# Patient Record
Sex: Female | Born: 1960 | Race: White | Hispanic: No | Marital: Married | Smoking: Never smoker
Health system: Southern US, Community
[De-identification: ages and names within clinical notes are randomized; demographics above are authoritative.]

---

## 1997-06-18 ENCOUNTER — Ambulatory Visit (HOSPITAL_COMMUNITY): Admission: RE | Admit: 1997-06-18 | Discharge: 1997-06-18 | Payer: Self-pay | Admitting: Obstetrics and Gynecology

## 1998-09-03 ENCOUNTER — Other Ambulatory Visit: Admission: RE | Admit: 1998-09-03 | Discharge: 1998-09-03 | Payer: Self-pay | Admitting: Obstetrics and Gynecology

## 1999-03-23 ENCOUNTER — Other Ambulatory Visit: Admission: RE | Admit: 1999-03-23 | Discharge: 1999-03-23 | Payer: Self-pay | Admitting: *Deleted

## 1999-03-29 ENCOUNTER — Inpatient Hospital Stay (HOSPITAL_COMMUNITY): Admission: AD | Admit: 1999-03-29 | Discharge: 1999-03-29 | Payer: Self-pay | Admitting: Obstetrics and Gynecology

## 1999-03-29 ENCOUNTER — Encounter: Payer: Self-pay | Admitting: Obstetrics and Gynecology

## 1999-04-01 ENCOUNTER — Ambulatory Visit (HOSPITAL_COMMUNITY): Admission: RE | Admit: 1999-04-01 | Discharge: 1999-04-01 | Payer: Self-pay | Admitting: Obstetrics and Gynecology

## 1999-04-01 ENCOUNTER — Encounter (INDEPENDENT_AMBULATORY_CARE_PROVIDER_SITE_OTHER): Payer: Self-pay | Admitting: *Deleted

## 1999-10-25 ENCOUNTER — Other Ambulatory Visit: Admission: RE | Admit: 1999-10-25 | Discharge: 1999-10-25 | Payer: Self-pay | Admitting: Obstetrics and Gynecology

## 2000-10-24 ENCOUNTER — Other Ambulatory Visit: Admission: RE | Admit: 2000-10-24 | Discharge: 2000-10-24 | Payer: Self-pay | Admitting: Obstetrics and Gynecology

## 2002-09-26 ENCOUNTER — Other Ambulatory Visit: Admission: RE | Admit: 2002-09-26 | Discharge: 2002-09-26 | Payer: Self-pay | Admitting: Obstetrics and Gynecology

## 2002-11-21 ENCOUNTER — Ambulatory Visit (HOSPITAL_COMMUNITY): Admission: RE | Admit: 2002-11-21 | Discharge: 2002-11-21 | Payer: Self-pay | Admitting: Obstetrics and Gynecology

## 2002-11-21 ENCOUNTER — Encounter: Payer: Self-pay | Admitting: Obstetrics and Gynecology

## 2002-12-18 ENCOUNTER — Encounter: Payer: Self-pay | Admitting: Obstetrics and Gynecology

## 2002-12-18 ENCOUNTER — Ambulatory Visit (HOSPITAL_COMMUNITY): Admission: RE | Admit: 2002-12-18 | Discharge: 2002-12-18 | Payer: Self-pay | Admitting: Obstetrics and Gynecology

## 2003-02-12 ENCOUNTER — Ambulatory Visit (HOSPITAL_COMMUNITY): Admission: RE | Admit: 2003-02-12 | Discharge: 2003-02-12 | Payer: Self-pay | Admitting: Obstetrics and Gynecology

## 2003-02-28 ENCOUNTER — Inpatient Hospital Stay (HOSPITAL_COMMUNITY): Admission: AD | Admit: 2003-02-28 | Discharge: 2003-02-28 | Payer: Self-pay | Admitting: Obstetrics and Gynecology

## 2003-04-10 ENCOUNTER — Inpatient Hospital Stay (HOSPITAL_COMMUNITY): Admission: AD | Admit: 2003-04-10 | Discharge: 2003-04-14 | Payer: Self-pay | Admitting: Obstetrics and Gynecology

## 2008-09-11 ENCOUNTER — Ambulatory Visit: Payer: Self-pay | Admitting: Oncology

## 2008-09-25 LAB — CBC WITH DIFFERENTIAL/PLATELET
BASO%: 0.4 % (ref 0.0–2.0)
Basophils Absolute: 0 10*3/uL (ref 0.0–0.1)
EOS%: 1.8 % (ref 0.0–7.0)
HCT: 33.6 % — ABNORMAL LOW (ref 34.8–46.6)
MCV: 68.7 fL — ABNORMAL LOW (ref 79.5–101.0)
MONO#: 0.5 10*3/uL (ref 0.1–0.9)
NEUT#: 2.8 10*3/uL (ref 1.5–6.5)
Platelets: 195 10*3/uL (ref 145–400)
RDW: 15.9 % — ABNORMAL HIGH (ref 11.2–14.5)
lymph#: 2.1 10*3/uL (ref 0.9–3.3)

## 2008-09-25 LAB — MORPHOLOGY

## 2008-09-29 LAB — PROTEIN ELECTROPHORESIS, SERUM
Alpha-1-Globulin: 3.5 % (ref 2.9–4.9)
Gamma Globulin: 20.4 % — ABNORMAL HIGH (ref 11.1–18.8)

## 2008-09-29 LAB — FOLATE: Folate: 13.3 ng/mL

## 2008-09-29 LAB — VITAMIN B12: Vitamin B-12: 409 pg/mL (ref 211–911)

## 2008-09-29 LAB — IRON AND TIBC: Iron: 77 ug/dL (ref 42–145)

## 2008-09-30 LAB — FECAL OCCULT BLOOD, GUAIAC: Occult Blood: NEGATIVE

## 2008-10-02 LAB — HEMOGLOBINOPATHY EVALUATION
Hemoglobin Other: 0 % (ref 0.0–0.0)
Hgb F Quant: 0 % (ref 0.0–2.0)
Hgb S Quant: 0 % (ref 0.0–0.0)

## 2008-11-23 ENCOUNTER — Ambulatory Visit: Payer: Self-pay | Admitting: Oncology

## 2009-08-08 ENCOUNTER — Emergency Department (HOSPITAL_COMMUNITY): Admission: EM | Admit: 2009-08-08 | Discharge: 2009-08-08 | Payer: Self-pay | Admitting: Emergency Medicine

## 2010-06-21 LAB — DIFFERENTIAL
Eosinophils Relative: 3 % (ref 0–5)
Lymphocytes Relative: 37 % (ref 12–46)
Lymphs Abs: 1.7 10*3/uL (ref 0.7–4.0)
Monocytes Absolute: 0.5 10*3/uL (ref 0.1–1.0)
Monocytes Relative: 12 % (ref 3–12)

## 2010-06-21 LAB — CBC
MCV: 73.4 fL — ABNORMAL LOW (ref 78.0–100.0)
Platelets: 288 10*3/uL (ref 150–400)
WBC: 4.6 10*3/uL (ref 4.0–10.5)

## 2010-06-21 LAB — COMPREHENSIVE METABOLIC PANEL
AST: 21 U/L (ref 0–37)
Albumin: 4.2 g/dL (ref 3.5–5.2)
Calcium: 8.7 mg/dL (ref 8.4–10.5)
Chloride: 105 mEq/L (ref 96–112)
Creatinine, Ser: 0.78 mg/dL (ref 0.4–1.2)
GFR calc Af Amer: >60 mL/min (ref >60)
Total Protein: 8.1 g/dL (ref 6.0–8.3)

## 2010-06-21 LAB — URINALYSIS, ROUTINE W REFLEX MICROSCOPIC
Glucose, UA: NEGATIVE mg/dL
Hgb urine dipstick: NEGATIVE
Specific Gravity, Urine: 1.033 — ABNORMAL HIGH (ref 1.005–1.030)

## 2010-08-19 NOTE — Discharge Summary (Signed)
NAME:  Taylor Contreras, Taylor Contreras                           ACCOUNT NO.:  [id number]   MEDICAL RECORD NO.:  [id number]                   PATIENT TYPE:  INP   LOCATION:  9138                                 FACILITY:  [facility]   PHYSICIAN:  Osborn Coho, M.D.                DATE OF BIRTH:  13-May-1960   DATE OF ADMISSION:  04/10/2003  DATE OF DISCHARGE:  04/14/2003                                 DISCHARGE SUMMARY   ADMISSION DIAGNOSES:  1. Intrauterine pregnancy at term.  2. Spontaneous rupture of the membranes.  3. Advanced maternal age.  4. History of fibroids.  5. Group B Strep negative.   DISCHARGE DIAGNOSES:  1. Intrauterine pregnancy at term.  2. Spontaneous rupture of the membranes.  3. Advanced maternal age.  4. History of fibroids.  5. Group B Strep negative.  6. Nonreassuring fetal heart rate tracing during labor.  7. Status post primary low transverse cesarean section.  8. Breast-feeding.  9. Plans IUD for contraception.   PROCEDURE:  Primary low transverse cesarean section for delivery of a viable  female infant named Taylor Contreras who weighed 7 pounds and had Apgars of 8 and 9 on  April 11, 2003 attended in delivery by Dr. Leonard Schwartz and Nigel Bridgeman, CNM.   HOSPITAL COURSE:  Taylor Contreras is a 50 year old married black female gravida  6, para 3-0-2-3 at [redacted] weeks gestation who was admitted with spontaneous  rupture of the membranes with clear fluid on April 10, 2003.  She was  started on Pitocin after admission secondary to lack of labor progression.  She then was noted to have some late decelerations and an Amnioinfusion was  started which resolved the decelerations around 8:30 p.m. on April 10, 2003.  As she progressed in labor, however, late decelerations reoccurred  and did not resolve once the Pitocin was discontinued and other  resuscitative measures were taken and at this point she was 8 cm, at a 0  station with continued late decelerations.  A cesarean section  was  recommended and accepted by the patient and she underwent the same for  delivery of a viable female infant named Taylor Contreras who had Apgars of 8 and 9  and weighed 7 pounds.  She was noted to be in OP presentation upon delivery.  She was delivered approximately 1 a.m. on April 11, 2003 attended in  delivery by Dr. Leonard Schwartz and Nigel Bridgeman CNM.  Please see  operative note for further details.  Postoperatively the patient has done  well.  She is ambulating, voiding, and eating without difficulty.  She is  also breast-feeding without difficulty.  She plans the Mirena IUD for  contraception at six weeks.  She is deemed ready for discharge today.   DISCHARGE INSTRUCTIONS:  As per the [facility] handout.   DISCHARGE MEDICATIONS:  1. Motrin 600 mg p.o. q.6h. p.r.n. for pain.  2. Tylox one to two p.o. q.4-6h. p.r.n. for pain.  3. Prenatal vitamins daily.   DISCHARGE LABORATORIES:  Hemoglobin 8.7, WBC count 8.8, platelets 165,000.   DISCHARGE FOLLOWUP:  In four weeks at [facility] for prep for  IUD insertion at six weeks or p.r.n.     Concha Pyo. Duplantis, C.N.M.              Osborn Coho, M.D.    SJD/MEDQ  D:  04/14/2003  T:  04/14/2003  Job:  [id number]

## 2010-08-19 NOTE — H&P (Signed)
NAME:  Taylor Contreras, Taylor Contreras                           ACCOUNT NO.:  [id number]   MEDICAL RECORD NO.:  [id number]                   PATIENT TYPE:  INP   LOCATION:  9175                                 [facility]ACILITY:  [facility]   PHYSICIAN:  Janine Limbo, M.D.            DATE OF BIRTH:  03/29/1961   DATE OF ADMISSION:  04/10/2003  DATE OF DISCHARGE:                                HISTORY & PHYSICAL   HISTORY OF PRESENT ILLNESS:  Taylor Contreras is a 50 year old married black  female gravida 6 para 3-0-2-3 at __________ weeks gestation who presents  from the office with leaking clear fluid since 11:15 a.m.  She reports  irregular uterine contractions.  She denies bleeding, headache, nausea and  vomiting, or visual disturbances.  Her pregnancy has been followed by the  [facility] M.D. service and has been remarkable for:  1. AMA, declines amniocentesis.  2. Consanguinity.  3. History of fibroids.  4. Group B strep negative.   PRENATAL LABORATORY DATA:  Her prenatal labs were collected on September 26, 2002.  Hemoglobin 11.3; hematocrit 34.7; platelets 279,000.  Blood type O  positive, antibody negative.  Sickle cell trait negative.  RPR nonreactive.  Rubella immune.  Hepatitis B surface antigen negative.  Pap smear normal.  Gonorrhea negative, chlamydia negative.  On November 21, 2002 her quad screen  was within normal limits.  On January 29, 2003 her one-hour Glucola was 102  and her hemoglobin at that time was 10.4.  Culture of the vaginal tract for  group B strep, gonorrhea, and chlamydia from March 25, 2003 were all  negative.   HISTORY OF PRESENT PREGNANCY:  She presented for care at [facility] on  September 26, 2002 at [redacted] weeks gestation.  Amniocentesis was recommended to the  patient based on her age, which she declined.  Pregnancy ultrasonography at  [redacted] weeks gestation on November 21, 2002 showed normal anatomy.  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  The patient was referred to a cardiologist at [redacted]  weeks gestation for feeling lightheaded and dizzy.  She reports that her  cardiology workup was normal.  The rest of her prenatal care was  unremarkable.   OBSTETRICAL HISTORY:  She is a gravida 6 para 3-0-2-3.  In January 1993 she  vaginally delivered a female infant weighing 6 pounds 15 ounces at [redacted] weeks  gestation after 24 hours in labor.  She had an epidural for anesthesia and  the infant's name was [place]  In 1995 at [redacted] weeks gestation she has an SAB  with no problems.  In July 1995 she had a vaginal delivery of a female  infant weighing 7 pounds 8 ounces at [redacted] weeks gestation after 16 hours of  labor.  She  had an epidural for anesthesia.  The infant's name was  Jerrel Ivory.  Her first and second children were born in [place].  In May  1997 she had a vaginal delivery of a female infant weighing 6 pounds 3  ounces at [redacted] weeks gestation after 4 hours in labor.  She had no anesthesia.  The infant's name was Valli Glance and was born at [facility] and delivered  by Dr. Stefano Gaul.  In 2000 at [redacted] weeks gestation she had an SAB with a D&C.  She reports having hyperemesis with her pregnancies in 1993 and 1997.   MEDICAL HISTORY:  She has no medication allergies.  She has used condoms in  the past for contraception.  She reports having had a few yeast infections.  She reports having had the usual childhood illnesses.  She has a history of  uterine fibroids.  She has anemia occasionally.  She has a history of  urinary tract infection.   FAMILY MEDICAL HISTORY:  Remarkable for mother with MI as well as  hypertension.  Mother also has diabetes as well as hyperthyroidism.  Her  mother receives dialysis.  The patient's uncle with liver cancer.  Maternal  grandmother with history of breast cancer.   GENETIC HISTORY:   Remarkable for the patient's age being 78.  The patient  and her husband are also first cousins.   SOCIAL HISTORY:  The patient is married to the father of the baby who is  involved and supportive.  His name is Rawle.  The patient and father of the  baby are both college educated and employed full-time.  They deny any  alcohol, tobacco, or illicit drug use with the pregnancy.   OBJECTIVE DATA:  VITAL SIGNS:  Stable; she is afebrile.  HEENT:  Grossly within normal limits.  CHEST:  Clear to auscultation.  HEART:  Regular rate and rhythm.  ABDOMEN:  Gravid in contour with fundal height extending approximately 37 cm  above the pubic symphysis.  Fetal heart rate obtained by Doppler in the  140s.  Uterine contractions are irregular per the patient.  PELVIC:  Remarkable for copious clear fluid positive for nitrazine and  positive for ferning.  Cervix is 2.5, 50%, vertex, -3, confirmed by  ultrasound.  EXTREMITIES:  Within normal limits.   ASSESSMENT:  1. Intrauterine pregnancy at term.  2. Premature rupture of membranes.  3. Group B Streptococcus negative.   PLAN:  1. Admit to labor and delivery per consult with Dr. Stefano Gaul.  2. Routine M.D. orders.  3. Anticipate normal spontaneous vaginal birth.     Cam Hai, C.N.M.                     Janine Limbo, M.D.    KS/MEDQ  D:  04/10/2003  T:  04/10/2003  Job:  [id number]

## 2010-08-19 NOTE — Op Note (Signed)
NAME:  Taylor Contreras, Taylor Contreras                           ACCOUNT NO.:  [id number]   MEDICAL RECORD NO.:  [id number]                   PATIENT TYPE:  INP   LOCATION:  9138                                 [facility]ILITY:  [facility]   PHYSICIAN:  Janine Limbo, M.D.            DATE OF BIRTH:  08-05-1960   DATE OF PROCEDURE:  04/11/2003  DATE OF DISCHARGE:                                 OPERATIVE REPORT   PREOPERATIVE DIAGNOSES:  1. Term intrauterine pregnancy.  2. Nonreassuring fetal heart rate tracing.   POSTOPERATIVE DIAGNOSES:  1. Term intrauterine pregnancy.  2. Nonreassuring fetal heart rate tracing.   PROCEDURE:  Primary low transverse cesarean section.   SURGEON:  Janine Limbo, M.D.   ANESTHESIA:  Epidural.   DISPOSITION:  Taylor Contreras is a 50 year old female, gravida 6, para 3-0-2-3,  who presents at term with ruptured membranes on April 10, 2003.  The  patient labored very slowly.  Pitocin augmentation was begun.  The patient  had late decelerations and variable decelerations.  An amnioinfusion was  performed.  The patient dilated her cervix to 8 cm but did not dilate any  further.  The late decelerations became repetitive.  They did not respond to  oxygen, positioning, cessation of Pitocin, or scalp stimulation.  The  decision was made to proceed with cesarean delivery.  The risks and benefits  for the procedure were reviewed, including but not limited to anesthetic  complications, bleeding, infection, and possible damage to the surrounding  organs.   FINDINGS:  A 7 pound female infant Taylor Contreras) was delivered.  The Apgars were  8 at one minute and 9 at five minutes.  The infant was in a persistent  occiput posterior presentation.  No nuchal cords or body cords were  identified.  There was a small fibroid present on the fundus of the uterus.  The uterus, fallopian tubes, and the ovaries were normal otherwise.   DESCRIPTION OF PROCEDURE:  The patient was taken to the operating  room,  where it was determined that the epidural she had received for labor would  be adequate for cesarean delivery.  The patient's abdomen and perineum were  prepped with multiple layers of Betadine.  The patient was  then sterilely  draped.  The lower abdomen was injected with 10 mL of 0.5% Marcaine with  epinephrine.  A low transverse incision was made and carried sharply through  the subcutaneous tissue, the fascia, and the anterior peritoneum.  An  incision was made in the lower uterine segment and extended transversely.  The fetal head was delivered without difficulty.  The mouth and nose were  suctioned  The remainder of the infant was then delivered.  The cord was  clamped and cut and the infant was handed to the awaiting pediatric team.  Routine cord blood studies were obtained.  The placenta was removed.  The  uterine cavity was cleaned of  amniotic fluid, clotted blood, and membranes.  The uterine incision was closed using a running locking suture of 2-0  Vicryl, followed by an imbricating suture of 2-0 Vicryl.  Hemostasis was  adequate.  The pericolonic gutters were cleaned of amniotic fluid and  clotted blood.  The anterior peritoneum and the abdominal musculature were  reapproximated in the midline using 2-0 Vicryl.  The fascia was closed using  a running suture of 3-0 Vicryl, followed by three interrupted sutures of 0  Vicryl.  The subcutaneous layer was closed using 0  Vicryl.  The skin was reapproximated using skin staples.  The sponge,  needle, and instrument counts were correct on two occasions.  The estimated  blood loss was 800 mL.  The patient did tolerate her procedure well.  The  infant was taken to the full-term nursery in stable condition.  The patient  was taken to the recovery room in stable condition.                                               Janine Limbo, M.D.    AVS/MEDQ  D:  04/11/2003  T:  04/11/2003  Job:  [id number]

## 2011-09-12 ENCOUNTER — Other Ambulatory Visit (HOSPITAL_COMMUNITY)
Admission: RE | Admit: 2011-09-12 | Discharge: 2011-09-12 | Disposition: A | Discharge status: Home / Self Care | Payer: Commercial Managed Care - PPO | Source: Ambulatory Visit | Attending: Family Medicine | Admitting: Family Medicine

## 2011-09-12 ENCOUNTER — Other Ambulatory Visit: Payer: Self-pay | Admitting: Family Medicine

## 2011-09-12 DIAGNOSIS — Z124 Encounter for screening for malignant neoplasm of cervix: Secondary | ICD-10-CM | POA: Insufficient documentation

## 2011-09-12 DIAGNOSIS — Z1159 Encounter for screening for other viral diseases: Secondary | ICD-10-CM | POA: Insufficient documentation

## 2013-05-22 ENCOUNTER — Ambulatory Visit: Payer: Self-pay | Admitting: Obstetrics and Gynecology

## 2013-08-08 ENCOUNTER — Other Ambulatory Visit: Payer: Self-pay | Admitting: Family Medicine

## 2013-08-08 ENCOUNTER — Ambulatory Visit
Admission: RE | Admit: 2013-08-08 | Discharge: 2013-08-08 | Disposition: A | Discharge status: Home / Self Care | Payer: 59 | Source: Ambulatory Visit | Attending: Family Medicine | Admitting: Family Medicine

## 2013-08-08 DIAGNOSIS — M549 Dorsalgia, unspecified: Secondary | ICD-10-CM

## 2013-08-29 ENCOUNTER — Ambulatory Visit: Payer: 59 | Attending: Family Medicine

## 2013-08-29 ENCOUNTER — Ambulatory Visit: Payer: 59 | Admitting: Physical Therapy

## 2013-08-29 DIAGNOSIS — IMO0001 Reserved for inherently not codable concepts without codable children: Secondary | ICD-10-CM | POA: Insufficient documentation

## 2013-08-29 DIAGNOSIS — R293 Abnormal posture: Secondary | ICD-10-CM | POA: Insufficient documentation

## 2013-08-29 DIAGNOSIS — M545 Low back pain, unspecified: Secondary | ICD-10-CM | POA: Insufficient documentation

## 2013-09-16 ENCOUNTER — Ambulatory Visit: Payer: 59 | Attending: Family Medicine | Admitting: Rehabilitation

## 2013-09-16 DIAGNOSIS — Z5189 Encounter for other specified aftercare: Secondary | ICD-10-CM | POA: Insufficient documentation

## 2013-09-16 DIAGNOSIS — M545 Low back pain, unspecified: Secondary | ICD-10-CM | POA: Insufficient documentation

## 2013-09-16 DIAGNOSIS — R293 Abnormal posture: Secondary | ICD-10-CM | POA: Insufficient documentation

## 2013-09-18 ENCOUNTER — Ambulatory Visit: Payer: 59 | Admitting: Rehabilitation

## 2013-09-23 ENCOUNTER — Ambulatory Visit: Payer: 59 | Admitting: Rehabilitation

## 2013-09-25 ENCOUNTER — Encounter: Payer: 59 | Admitting: Rehabilitation

## 2013-09-30 ENCOUNTER — Encounter: Payer: 59 | Admitting: Rehabilitation

## 2013-10-07 ENCOUNTER — Ambulatory Visit: Payer: 59 | Attending: Family Medicine | Admitting: Physical Therapy

## 2013-10-07 DIAGNOSIS — M545 Low back pain, unspecified: Secondary | ICD-10-CM | POA: Insufficient documentation

## 2013-10-07 DIAGNOSIS — R293 Abnormal posture: Secondary | ICD-10-CM | POA: Insufficient documentation

## 2013-10-07 DIAGNOSIS — Z5189 Encounter for other specified aftercare: Secondary | ICD-10-CM | POA: Insufficient documentation

## 2013-10-14 ENCOUNTER — Ambulatory Visit: Payer: 59

## 2013-10-21 ENCOUNTER — Encounter: Payer: 59 | Admitting: Physical Therapy

## 2013-10-28 ENCOUNTER — Encounter: Payer: 59 | Admitting: Physical Therapy

## 2014-07-28 ENCOUNTER — Ambulatory Visit
Admit: 2014-07-28 | Disposition: A | Discharge status: Home / Self Care | Payer: Self-pay | Attending: Family Medicine | Admitting: Family Medicine

## 2014-07-29 ENCOUNTER — Ambulatory Visit
Admit: 2014-07-29 | Disposition: A | Discharge status: Home / Self Care | Payer: Self-pay | Attending: Family Medicine | Admitting: Family Medicine

## 2014-11-02 ENCOUNTER — Emergency Department (HOSPITAL_COMMUNITY): Payer: 59

## 2014-11-02 ENCOUNTER — Observation Stay (HOSPITAL_BASED_OUTPATIENT_CLINIC_OR_DEPARTMENT_OTHER): Payer: 59

## 2014-11-02 ENCOUNTER — Encounter (HOSPITAL_COMMUNITY): Payer: Self-pay | Admitting: Emergency Medicine

## 2014-11-02 ENCOUNTER — Observation Stay (HOSPITAL_COMMUNITY)
Admission: EM | Admit: 2014-11-02 | Discharge: 2014-11-03 | Disposition: A | Discharge status: Home / Self Care | Payer: 59 | Attending: Internal Medicine | Admitting: Internal Medicine

## 2014-11-02 DIAGNOSIS — R079 Chest pain, unspecified: Secondary | ICD-10-CM

## 2014-11-02 DIAGNOSIS — R0789 Other chest pain: Principal | ICD-10-CM | POA: Insufficient documentation

## 2014-11-02 DIAGNOSIS — E876 Hypokalemia: Secondary | ICD-10-CM | POA: Insufficient documentation

## 2014-11-02 DIAGNOSIS — R9431 Abnormal electrocardiogram [ECG] [EKG]: Secondary | ICD-10-CM | POA: Diagnosis not present

## 2014-11-02 LAB — MRSA PCR SCREENING: MRSA by PCR: NEGATIVE

## 2014-11-02 LAB — BASIC METABOLIC PANEL
ANION GAP: 6 (ref 5–15)
BUN: 16 mg/dL (ref 6–20)
CO2: 22 mmol/L (ref 22–32)
Calcium: 9.5 mg/dL (ref 8.9–10.3)
Chloride: 109 mmol/L (ref 101–111)
Creatinine, Ser: 0.89 mg/dL (ref 0.44–1.00)
GFR calc non Af Amer: >60 mL/min (ref >60)
Glucose, Bld: 98 mg/dL (ref 65–99)
Potassium: 3.2 mmol/L — ABNORMAL LOW (ref 3.5–5.1)
SODIUM: 137 mmol/L (ref 135–145)

## 2014-11-02 LAB — CBC
HCT: 36.7 % (ref 36.0–46.0)
Hemoglobin: 11.8 g/dL — ABNORMAL LOW (ref 12.0–15.0)
MCH: 21.5 pg — ABNORMAL LOW (ref 26.0–34.0)
MCHC: 32.2 g/dL (ref 30.0–36.0)
MCV: 67 fL — AB (ref 78.0–100.0)
PLATELETS: 258 10*3/uL (ref 150–400)
RBC: 5.48 MIL/uL — AB (ref 3.87–5.11)
RDW: 17.1 % — ABNORMAL HIGH (ref 11.5–15.5)
WBC: 5.4 10*3/uL (ref 4.0–10.5)

## 2014-11-02 LAB — SEDIMENTATION RATE: Sed Rate: 13 mm/hr (ref 0–22)

## 2014-11-02 LAB — C-REACTIVE PROTEIN: CRP: <0.5 mg/dL (ref <1.0)

## 2014-11-02 LAB — TSH: TSH: 1.516 u[IU]/mL (ref 0.350–4.500)

## 2014-11-02 LAB — TROPONIN I

## 2014-11-02 LAB — I-STAT TROPONIN, ED: TROPONIN I, POC: 0 ng/mL (ref 0.00–0.08)

## 2014-11-02 LAB — D-DIMER, QUANTITATIVE (NOT AT ARMC): D-Dimer, Quant: 0.59 ug/mL-FEU — ABNORMAL HIGH (ref 0.00–0.48)

## 2014-11-02 LAB — MAGNESIUM: Magnesium: 1.9 mg/dL (ref 1.7–2.4)

## 2014-11-02 MED ORDER — POTASSIUM CHLORIDE CRYS ER 20 MEQ PO TBCR
20.0000 meq | EXTENDED_RELEASE_TABLET | Freq: Two times a day (BID) | ORAL | Status: DC
  Administered 2014-11-02 – 2014-11-03 (×3): 20 meq via ORAL
  Filled 2014-11-02 (×4): qty 1

## 2014-11-02 MED ORDER — ASPIRIN 81 MG PO CHEW
244.0000 mg | CHEWABLE_TABLET | Freq: Once | ORAL | Status: AC
  Administered 2014-11-02: 243 mg via ORAL
  Filled 2014-11-02: qty 4

## 2014-11-02 MED ORDER — ACETAMINOPHEN 325 MG PO TABS: 650.0000 mg | ORAL_TABLET | ORAL | Status: DC | PRN

## 2014-11-02 MED ORDER — MORPHINE SULFATE 2 MG/ML IJ SOLN: 2.0000 mg | INTRAMUSCULAR | Status: DC | PRN

## 2014-11-02 MED ORDER — SODIUM CHLORIDE 0.9 % IV SOLN
INTRAVENOUS | Status: DC
  Administered 2014-11-02: 07:00:00 via INTRAVENOUS

## 2014-11-02 MED ORDER — IBUPROFEN 200 MG PO TABS
800.0000 mg | ORAL_TABLET | Freq: Four times a day (QID) | ORAL | Status: DC | PRN
  Administered 2014-11-02: 800 mg via ORAL
  Filled 2014-11-02: qty 1

## 2014-11-02 MED ORDER — IBUPROFEN 800 MG PO TABS
800.0000 mg | ORAL_TABLET | Freq: Three times a day (TID) | ORAL | Status: DC
  Administered 2014-11-02 – 2014-11-03 (×3): 800 mg via ORAL
  Filled 2014-11-02 (×5): qty 1

## 2014-11-02 MED ORDER — NITROGLYCERIN 2 % TD OINT
1.0000 [in_us] | TOPICAL_OINTMENT | Freq: Once | TRANSDERMAL | Status: AC
  Administered 2014-11-02: 1 [in_us] via TOPICAL
  Filled 2014-11-02: qty 1

## 2014-11-02 MED ORDER — SODIUM CHLORIDE 0.9 % IV SOLN
INTRAVENOUS | Status: AC
  Administered 2014-11-02 (×2): via INTRAVENOUS

## 2014-11-02 MED ORDER — GI COCKTAIL ~~LOC~~: 30.0000 mL | Freq: Four times a day (QID) | ORAL | Status: DC | PRN

## 2014-11-02 MED ORDER — PANTOPRAZOLE SODIUM 40 MG PO TBEC
40.0000 mg | DELAYED_RELEASE_TABLET | Freq: Every day | ORAL | Status: DC
  Administered 2014-11-02 – 2014-11-03 (×2): 40 mg via ORAL
  Filled 2014-11-02 (×2): qty 1

## 2014-11-02 MED ORDER — ONDANSETRON HCL 4 MG/2ML IJ SOLN: 4.0000 mg | Freq: Four times a day (QID) | INTRAMUSCULAR | Status: DC | PRN

## 2014-11-02 MED ORDER — ENOXAPARIN SODIUM 40 MG/0.4ML ~~LOC~~ SOLN
40.0000 mg | SUBCUTANEOUS | Status: DC
  Administered 2014-11-02 – 2014-11-03 (×2): 40 mg via SUBCUTANEOUS
  Filled 2014-11-02 (×2): qty 0.4

## 2014-11-02 NOTE — Consult Note (Signed)
Patient ID: Taylor Contreras MRN: [id number], DOB/AGE: Oct 27, 1960   Admit date: 11/02/2014   Primary Physician: Redmond Baseman, MD Primary Cardiologist: New (seen in the past by Dr. Donnie Aho)  Pt. Profile:  54 year old African American female with no significant past history presenting with a one-week history of progressive left-sided chest and arm pain.  Problem List  History reviewed. No pertinent past medical history.  Past Surgical History  Procedure Laterality Date  . Cesarean section       Allergies  No Known Allergies  HPI  The patient is a 54 year old African-American female with no significant past medical history. She denies any history of hypertension, hyperlipidemia, diabetes or tobacco abuse. She denies any family history of CAD but reports that her mother had a history of atrial fibrillation and required multiple cardioversion's. She was seen in the past by Dr. Donnie Aho after her PCP referred her for evaluation after it was discovered that she had a cardiac murmur. She states that he ordered an echocardiogram however the results were not reported to her and she failed to follow-up. She assumes that her study was normal.  She presented to [facility] earlier today with complaints of a one-week history of progressive left-sided chest discomfort. She also notes radiation to her left upper extremity, neck and jaw. The discomfort is described as a pressure/heavy sensation. She denies any associated dyspnea, diaphoresis, lightheadedness, dizziness, nausea or vomiting. No syncope/near-syncope. She has experienced both resting and exertional symptoms. Yesterday while performing aerobics exercises at home, she noted worsening chest discomfort that was relieved with rest. She was also advised by one of her close friends, who is also an Charity fundraiser, to take aspirin whenever she had recurrent symptoms. She noted relief in symptoms with aspirin yesterday. She also developed nocturnal  pain earlier this morning that awoke her from her sleep. It was severe enough to prompt her to seek medical evaluation in the ED. She reports that her discomfort improved after she was given a nitroglycerin patch in the ED. However, she continues to have occasional recurrent chest pressure near her left axillary region. She denies any recent viral illness.   Home Medications  Prior to Admission medications   Medication Sig Start Date End Date Taking? Authorizing Provider  Cholecalciferol (VITAMIN D PO) Take 1 tablet by mouth daily.   Yes Historical Provider, MD  Lorcaserin HCl (BELVIQ) 10 MG TABS Take 1 tablet by mouth daily.   Yes Historical Provider, MD    Family History  Family History  Problem Relation Age of Onset  . Arrhythmia Mother     Social History  History   Social History  . Marital Status: Married    Spouse Name: N/A  . Number of Children: N/A  . Years of Education: N/A   Occupational History  . Not on file.   Social History Main Topics  . Smoking status: Never Smoker   . Smokeless tobacco: Not on file  . Alcohol Use: No  . Drug Use: No  . Sexual Activity: Not on file   Other Topics Concern  . Not on file   Social History Narrative  . No narrative on file     Review of Systems General:  No chills, fever, night sweats or weight changes.  Cardiovascular:  No chest pain, dyspnea on exertion, edema, orthopnea, palpitations, paroxysmal nocturnal dyspnea. Dermatological: No rash, lesions/masses Respiratory: No cough, dyspnea Urologic: No hematuria, dysuria Abdominal:   No nausea, vomiting, diarrhea, bright red blood per rectum, melena,  or hematemesis Neurologic:  No visual changes, wkns, changes in mental status. All other systems reviewed and are otherwise negative except as noted above.  Physical Exam  Blood pressure 146/103, pulse 70, temperature 98.1 F (36.7 C), temperature source Oral, resp. rate 16, height 5\' 5"  (1.651 m), weight 177 lb 4 oz  (80.4 kg), SpO2 100 %.  General: Pleasant, NAD Psych: Normal affect. Neuro: Alert and oriented X 3. Moves all extremities spontaneously. HEENT: Normal  Neck: Supple without bruits or JVD. Lungs:  Resp regular and unlabored, CTA. Heart: RRR , 1/6 SM at LSB. Abdomen: Soft, non-tender, non-distended, BS + x 4.  Extremities: No clubbing, cyanosis or edema. DP/PT/Radials 2+ and equal bilaterally.  Labs  Troponin [facility] Test)  Recent Labs  11/02/14 0611  TROPIPOC 0.00   No results for input(s): CKTOTAL, CKMB, TROPONINI in the last 72 hours. Lab Results  Component Value Date   WBC 5.4 11/02/2014   HGB 11.8* 11/02/2014   HCT 36.7 11/02/2014   MCV 67.0* 11/02/2014   PLT 258 11/02/2014     Recent Labs Lab 11/02/14 0607  NA 137  K 3.2*  CL 109  CO2 22  BUN 16  CREATININE 0.89  CALCIUM 9.5  GLUCOSE 98   No results found for: CHOL, HDL, LDLCALC, TRIG No results found for: DDIMER   Radiology/Studies  Dg Chest 2 View  11/02/2014   CLINICAL DATA:  Left chest pain for 1 week  EXAM: CHEST  2 VIEW  COMPARISON:  07/29/2014  FINDINGS: The heart size and mediastinal contours are within normal limits. Both lungs are clear. The visualized skeletal structures are unremarkable.  IMPRESSION: No active cardiopulmonary disease.   Electronically Signed   By: Ellery Plunk M.D.   On: 11/02/2014 06:52    ECG  Normal sinus rhythm. Slight diffuse ST elevations.   Echocardiogram 11/02/14  Study Conclusions  - Left ventricle: The cavity size was normal. Wall thickness was increased in a pattern of mild LVH. Systolic function was normal. The estimated ejection fraction was in the range of 55% to 60%. - Mitral valve: There was mild regurgitation. - Right ventricle: The cavity size was mildly dilated. - Right atrium: The atrium was mildly dilated.    ASSESSMENT AND PLAN  Principal Problem:   Chest pain at rest Active Problems:   Hypokalemia   1. Chest pain:  chest  pain /pressure for the past couple wks  Intermittent  ?CAD,  left-sided chest pressure radiating to left upper extremity/ neck/jaw, also worse with physical activity yesterday  Relieved with rest  No pleuritic or positional  On other days did not associate with activity  Relieved now with nitroglycerin. EKG  with mild diffuse ST elevations and sl PR depression. Her EKG is also not much changed compared to prior EKGs. Her white count is normal and she denies any recent viral illnesses. Laboratory work including C-reactive protein, erythrocyte sedimentation rate and troponins are pending. Her echocardiogram reveals normal systolic function and no evidence of pericardial effusion. We will base further workup on lab findings For now would Rx with PPI, NSAID.  Continue tele.  Cycle enzymes If enzymes are neg would recomm stress myoview to r/o inducible ischemia    3. Hypokalemia: K is 3.2. K-Dur ordered.     Signed, Robbie Lis, PA-C 11/02/2014, 11:34 AM  Pt seen and examined  I have amended note above by B Simmons t oreflect my finding.s  CP is not completely typical for angina  I do not think it is percarditis  PLan as noted above.  Dietrich Pates

## 2014-11-02 NOTE — ED Provider Notes (Signed)
CSN: [id number]     Arrival date & time 11/02/14  8295 History   None    Chief Complaint  Patient presents with  . Chest Pain     (Consider location/radiation/quality/duration/timing/severity/associated sxs/prior Treatment) HPI   Blood pressure 142/91, pulse 76, temperature 98.1 F (36.7 C), temperature source Oral, resp. rate 16, height  (1.651 m), weight 171 lb (77.565 kg), SpO2 100 %.  Taylor Contreras is a 54 y.o. female complaining of left-sided chest pressure radiating to the left axilla, arm and left jaw onset 1 week ago increasing in frequency and severity. Woke her from sleep this morning at 5 AM, she took a low-dose aspirin with some releif. She states her pain is 5 out of 10, it's been happening intermittently over the last 7 days,episodes last 45 minutes to an hour. Associated with shortness of breath and lightheadness. Patient denies cough, fever, chills, increasing peripheral edema, orthopnea, PND, recent immobilization, calf pain or leg swelling, diabetes, hypertension, hyperlipidemia, smoking history. Patient is not menstruating, she has the Mirena IUD. She denies pleuritic pain: Note that this contradicts triage note she clarifies by saying she feels like she can't take a deep breath but this does not exacerbate the pain. Was seen by cardiology approximately one year ago there was one no stress test. She has a family history of CHF in her mother. No family history of ACS.   PCP [facility]: [facility]  History reviewed. No pertinent past medical history. Past Surgical History  Procedure Laterality Date  . Cesarean section     Family History  Problem Relation Age of Onset  . Arrhythmia Mother    History  Substance Use Topics  . Smoking status: Never Smoker   . Smokeless tobacco: Not on file  . Alcohol Use: No   OB History    No data available     Review of Systems  10 systems reviewed and found to be negative, except as noted in the HPI.  Allergies  Review of patient's  allergies indicates no known allergies.  Home Medications   Prior to Admission medications   Medication Sig Start Date End Date Taking? Authorizing Provider  Cholecalciferol (VITAMIN D PO) Take 1 tablet by mouth daily.   Yes Historical Provider, MD  Lorcaserin HCl (BELVIQ) 10 MG TABS Take 1 tablet by mouth daily.   Yes Historical Provider, MD   BP 107/56 mmHg  Pulse 80  Temp(Src) 97.9 F (36.6 C) (Oral)  Resp 16  Ht  (1.651 m)  Wt 177 lb 4 oz (80.4 kg)  BMI 29.50 kg/m2  SpO2 97% Physical Exam  Constitutional: She is oriented to person, place, and time. She appears well-developed and well-nourished. No distress.  HENT:  Head: Normocephalic.  Mouth/Throat: Oropharynx is clear and moist.  Eyes: Conjunctivae and EOM are normal.  Neck: Normal range of motion. No JVD present. No tracheal deviation present.  Cardiovascular: Normal rate, regular rhythm and intact distal pulses.   Radial pulse equal bilaterally  Pulmonary/Chest: Effort normal and breath sounds normal. No stridor. No respiratory distress. She has no wheezes. She has no rales. She exhibits no tenderness.  Abdominal: Soft. She exhibits no distension and no mass. There is no tenderness. There is no rebound and no guarding.  Musculoskeletal: Normal range of motion. She exhibits no edema or tenderness.  No calf asymmetry, superficial collaterals, palpable cords, edema, Homans sign negative bilaterally.    Neurological: She is alert and oriented to person, place, and time.  Skin: Skin  is warm. She is not diaphoretic.  Psychiatric: She has a normal mood and affect.  Nursing note and vitals reviewed.   ED Course  Procedures (including critical care time) Labs Review Labs Reviewed  BASIC METABOLIC PANEL - Abnormal; Notable for the following:    Potassium 3.2 (*)    All other components within normal limits  CBC - Abnormal; Notable for the following:    RBC 5.48 (*)    Hemoglobin 11.8 (*)    MCV 67.0 (*)    MCH 21.5  (*)    RDW 17.1 (*)    All other components within normal limits  MRSA PCR SCREENING  SEDIMENTATION RATE  C-REACTIVE PROTEIN  TSH  MAGNESIUM  HIV ANTIBODY (ROUTINE TESTING)  ANTINUCLEAR ANTIBODIES, IFA  QUANTIFERON TB GOLD ASSAY (BLOOD)  TROPONIN I  TROPONIN I  TROPONIN I  D-DIMER, QUANTITATIVE (NOT AT [facility])  Taylor Contreras, ED    Imaging Review Dg Chest 2 View  11/02/2014   CLINICAL DATA:  Left chest pain for 1 week  EXAM: CHEST  2 VIEW  COMPARISON:  07/29/2014  FINDINGS: The heart size and mediastinal contours are within normal limits. Both lungs are clear. The visualized skeletal structures are unremarkable.  IMPRESSION: No active cardiopulmonary disease.   Electronically Signed   By: Ellery Plunk M.D.   On: 11/02/2014 06:52     EKG Interpretation   Date/Time:  Monday November 02 2014 06:00:30 EDT Ventricular Rate:  75 PR Interval:  179 QRS Duration: 89 QT Interval:  369 QTC Calculation: 412 R Axis:   60 Text Interpretation:  Sinus rhythm ST elev, probable normal early repol  pattern No significant change was found Confirmed by CAMPOS  MD, KEVIN  ([id number]) on 11/02/2014 7:01:13 AM      MDM   Final diagnoses:  Chest pain, unspecified chest pain type    Filed Vitals:   11/02/14 1315 11/02/14 1330 11/02/14 1345 11/02/14 1400  BP:    107/56  Pulse: 77 78 77 80  Temp:      TempSrc:      Resp:      Height:      Weight:      SpO2: 98% 99% 98% 97%    Medications  0.9 %  sodium chloride infusion ( Intravenous New Bag/Given 11/02/14 0657)  potassium chloride SA (K-DUR,KLOR-CON) CR tablet 20 mEq (20 mEq Oral Given 11/02/14 0739)  ibuprofen (ADVIL,MOTRIN) tablet 800 mg (800 mg Oral Given 11/02/14 0813)  acetaminophen (TYLENOL) tablet 650 mg (not administered)  ondansetron (ZOFRAN) injection 4 mg (not administered)  0.9 %  sodium chloride infusion ( Intravenous New Bag/Given 11/02/14 1042)  enoxaparin (LOVENOX) injection 40 mg (40 mg Subcutaneous Given 11/02/14 1140)   morphine 2 MG/ML injection 2 mg (not administered)  gi cocktail (Maalox,Lidocaine,Donnatal) (not administered)  ibuprofen (ADVIL,MOTRIN) tablet 800 mg (not administered)  pantoprazole (PROTONIX) EC tablet 40 mg (not administered)  aspirin chewable tablet 244 mg (243 mg Oral Given 11/02/14 0651)  nitroGLYCERIN (NITROGLYN) 2 % ointment 1 inch (1 inch Topical Given 11/02/14 1914)    Taylor Contreras is a pleasant 54 y.o. female presenting with Intermittent chest pain onset 1 week ago associated with shortness of breath and lightheadedness.patient is low risk by heart score however her story is concerning for crescendo, unstable angina. EKG shows early re-pole, no prior to compare to. Patient states that she seen cardiology in the past however there are no notes in our system.  Patient has very mild hypokalemia at  3.2, will replete orally, chest x-ray blood work otherwise unremarkable.  Repeat EKG shows diffuse mild ST elevation with PR depression consistent with pericarditis, clinically this is not a textbook pericarditis.  Case discussed with  [facility]ist Algis Downs: She accepts admission to a telemetry bed has asked me to consult cardiology for evaluation of possible pericarditis.  Repaged cardiology, no response, Hospitalist aware.      Taylor Emery, PA-C 11/02/14 1525  Taylor Bilis, MD 11/05/14 [phone]

## 2014-11-02 NOTE — H&P (Signed)
[facility] History and Physical                                                                                    Taylor Contreras, is a 54 y.o. female  MRN: [id number]   DOB - Apr 11, 1960  Admit Date - 11/02/2014  Outpatient Primary MD for the patient is Anthoney Harada, MD  Referring Physician:  Monico Blitz, EDP 8  Chief Complaint:   Chief Complaint  Patient presents with  . Chest Pain     HPI  Taylor Contreras  is a 54 y.o. 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 She presents to the emergency department with atypical chest pain. [name] reports that she has been more fatigued than usual for the past 2 weeks. She has noticed left-sided chest pressure intermittently while exercising. Last night this became more severe and it stopped her from exercising. She went and laid down on her left side to attempt to relieve the pain and fell asleep. This morning chest pain woke her at 5 AM. It was sharp radiating to the left shoulder and arm. It made her lightheaded and it was difficult to get a deep breath due to pain. Nothing improved the pain until Nitropaste was placed on her chest in the ER at approximately 7:30 AM. Mrs. Franze denies palpitations, orthopnea, lower extremity swelling. She did see a cardiologist approximately one year ago for a murmur.  She has been taking a new medication called "belviq"  for obesity for the past 3 months (she does not appear obese), but states that she has taken it less frequently recently.   Review of Systems   In addition to the HPI above,  No Fever-chills, No Headache, No changes with Vision or hearing, No problems swallowing food or Liquids, No Abdominal pain, No Nausea or Vomiting, Bowel movements are regular, No Blood in stool or Urine, No dysuria, No new skin rashes or bruises, No new joints pains-aches,  No new weakness, tingling, numbness in any extremity, No recent weight gain or loss, A full  10 point Review of Systems was done, except as stated above, all other Review of Systems were negative.  Past Medical History  History reviewed. No pertinent past medical history.  Past Surgical History  Procedure Laterality Date  . Cesarean section        Social History History  Substance Use Topics  . Smoking status: Never Smoker   . Smokeless tobacco: Not on file  . Alcohol Use: No   lives at home with husband. Works as a Archivist at [employer]. Is independent with ADLs.  Family History Mother had a history of tachycardia and was treated with ablation. She knows of no other cardiac disease in the family.  Prior to Admission medications   Medication Sig Start Date End Date Taking? Authorizing Provider  Cholecalciferol (VITAMIN D PO) Take 1 tablet by mouth daily.   Yes Historical Provider, MD  Lorcaserin HCl (BELVIQ) 10 MG TABS Take 1 tablet by mouth daily.   Yes Historical Provider, MD    No Known Allergies  Physical Exam  Vitals  Blood pressure 144/94, pulse 69, temperature 98.1 F (36.7 C), temperature source Oral, resp. rate 15, height '5\' 5"'  (1.651 m), weight 77.565 kg (171 lb), SpO2 99 %.   General: Pleasant, well-developed, well-nourished African-American female lying in bed in NAD, family at bedside  Psych:  Normal affect and insight, Not Suicidal or Homicidal, Awake Alert, Oriented X 3.  Neuro:   No F.N deficits, ALL C.Nerves Intact, Strength 5/5 all 4 extremities, Sensation intact all 4 extremities.  ENT:  Ears and Eyes appear Normal, Conjunctivae clear, PER. Moist oral mucosa without erythema or exudates.  Neck:  Supple, No lymphadenopathy appreciated, no obvious thyromegaly  Respiratory:  Symmetrical chest wall movement, Good air movement bilaterally, CTAB. No pain with inspiration.  Cardiac: Nitro paste on chest. Some pain to palpation of the left chest, RRR, 2/6 systolic murmur heard best at the right sternal border. No LE edema noted, no JVD.     Abdomen:  Positive bowel sounds, Soft, Non tender, Non distended,  No masses appreciated  Skin:  No Cyanosis, Normal Skin Turgor, No Skin Rash or Bruise.  Extremities:  Able to move all 4. 5/5 strength in each,  no effusions.  Data Review  CBC  Recent Labs Lab 11/02/14 0607  WBC 5.4  HGB 11.8*  HCT 36.7  PLT 258  MCV 67.0*  MCH 21.5*  MCHC 32.2  RDW 17.1*    Chemistries   Recent Labs Lab 11/02/14 0607  NA 137  K 3.2*  CL 109  CO2 22  GLUCOSE 98  BUN 16  CREATININE 0.89  CALCIUM 9.5     Urinalysis    Component Value Date/Time   COLORURINE YELLOW 08/08/2009 1515   APPEARANCEUR CLEAR 08/08/2009 1515   LABSPEC 1.033* 08/08/2009 1515   PHURINE 5.5 08/08/2009 1515   GLUCOSEU NEGATIVE 08/08/2009 1515   [place] 08/08/2009 1515   BILIRUBINUR NEGATIVE 08/08/2009 1515   KETONESUR TRACE* 08/08/2009 1515   PROTEINUR NEGATIVE 08/08/2009 1515   UROBILINOGEN 1.0 08/08/2009 1515   NITRITE NEGATIVE 08/08/2009 1515   LEUKOCYTESUR  08/08/2009 1515    NEGATIVE MICROSCOPIC NOT DONE ON URINES WITH NEGATIVE PROTEIN, BLOOD, LEUKOCYTES, NITRITE, OR GLUCOSE <1000 mg/dL.    Imaging results:   Dg Chest 2 View  11/02/2014   CLINICAL DATA:  Left chest pain for 1 week  EXAM: CHEST  2 VIEW  COMPARISON:  07/29/2014  FINDINGS: The heart size and mediastinal contours are within normal limits. Both lungs are clear. The visualized skeletal structures are unremarkable.  IMPRESSION: No active cardiopulmonary disease.   Electronically Signed   By: Andreas Newport M.D.   On: 11/02/2014 06:52    My personal review of EKG: SR.  Diffuse ST elevation/depression in multiple leads  Assessment & Plan  Principal Problem:   Chest pain at rest Active Problems:   Hypokalemia  Chest pain at rest, and on exertion. Seems atypical for acute coronary syndrome, but EKG suggests pericarditis. Will consult cardiology.  Admit to step down as she is still having chest pain. 2-D echo results  are pending. Will check ESR, CRP, HIV, ANA, quantity gold, TSH, cycle troponin Will place on motrin 800 mg TID.  Hypokalemia Will replete orally. Check serum magnesium   Consultants Called:  Cardiology  Family Communication:   Husband and daughter at bedside  Code Status:  Full code  Condition:  Guarded.  Potential Disposition: to home when chest pain has resolved and cardiac workup is complete  Time spent in minutes : 60  Imogene Burn,  PA-C on 11/02/2014 at 9:06 AM Between 7am to 7pm - Pager - [phone] After 7pm go to [email or web address] - password [email or web address] And look for the night coverage person covering me after hours  [facility]

## 2014-11-02 NOTE — Care Management Note (Signed)
Case Management Note  Patient Details  Name: Taylor Contreras MRN: [id number] Date of Birth: [birth year]  Subjective/Objective:       Adm w ch pain , poss pericarditis             Action/Plan:lives w fam   Expected Discharge Date:                  Expected Discharge Plan:  Home/Self Care  In-House Referral:     Discharge planning Services     Post Acute Care Choice:    Choice offered to:     DME Arranged:    DME Agency:     HH Arranged:    HH Agency:     Status of Service:     Medicare Important Message Given:    Date Medicare IM Given:    Medicare IM give by:    Date Additional Medicare IM Given:    Additional Medicare Important Message give by:     If discussed at Long Length of Stay Meetings, dates discussed:    Additional Comments: ur review done  Hanley Hays, RN 11/02/2014, 10:57 AM

## 2014-11-02 NOTE — ED Notes (Signed)
Admitting team at bedside.

## 2014-11-02 NOTE — ED Notes (Signed)
Pt. reports intermittent left chest pain radiating to left axilla/left arm onset last week , mild SOB , denies emesis or diaphoresis . Pain increases with deep inspiration . Pt. took 1 baby ASA prior to arrival , rates pain 4/10 .

## 2014-11-02 NOTE — Progress Notes (Signed)
  Echocardiogram 2D Echocardiogram has been performed.  Tye Savoy 11/02/2014, 8:39 AM

## 2014-11-03 ENCOUNTER — Other Ambulatory Visit (HOSPITAL_COMMUNITY): Payer: Commercial Managed Care - PPO

## 2014-11-03 ENCOUNTER — Inpatient Hospital Stay (HOSPITAL_COMMUNITY): Payer: 59

## 2014-11-03 DIAGNOSIS — R079 Chest pain, unspecified: Secondary | ICD-10-CM | POA: Diagnosis not present

## 2014-11-03 LAB — NM MYOCAR MULTI W/SPECT W/WALL MOTION / EF
CHL CUP MPHR: 169 {beats}/min
CHL CUP NUCLEAR SSS: 9
CSEPPHR: 113 {beats}/min
Estimated workload: 1 METS
Exercise duration (min): 5 min
LV sys vol: 25 mL
LVDIAVOL: 73 mL
Percent HR: 66 %
RATE: 0
Rest HR: 68 {beats}/min
SDS: 6
SRS: 3
TID: 0.97

## 2014-11-03 LAB — CBC
HEMATOCRIT: 33.3 % — AB (ref 36.0–46.0)
Hemoglobin: 10.7 g/dL — ABNORMAL LOW (ref 12.0–15.0)
MCH: 22.1 pg — ABNORMAL LOW (ref 26.0–34.0)
MCHC: 32.1 g/dL (ref 30.0–36.0)
MCV: 68.7 fL — AB (ref 78.0–100.0)
Platelets: 235 10*3/uL (ref 150–400)
RBC: 4.85 MIL/uL (ref 3.87–5.11)
RDW: 17.4 % — ABNORMAL HIGH (ref 11.5–15.5)
WBC: 4 10*3/uL (ref 4.0–10.5)

## 2014-11-03 LAB — ANTINUCLEAR ANTIBODIES, IFA: ANA Ab, IFA: NEGATIVE

## 2014-11-03 LAB — BASIC METABOLIC PANEL
ANION GAP: 7 (ref 5–15)
BUN: 9 mg/dL (ref 6–20)
CHLORIDE: 109 mmol/L (ref 101–111)
CO2: 22 mmol/L (ref 22–32)
CREATININE: 0.77 mg/dL (ref 0.44–1.00)
Calcium: 8.9 mg/dL (ref 8.9–10.3)
GFR calc Af Amer: >60 mL/min (ref >60)
GFR calc non Af Amer: >60 mL/min (ref >60)
Glucose, Bld: 90 mg/dL (ref 65–99)
Potassium: 3.7 mmol/L (ref 3.5–5.1)
SODIUM: 138 mmol/L (ref 135–145)

## 2014-11-03 LAB — HIV ANTIBODY (ROUTINE TESTING W REFLEX): HIV Screen 4th Generation wRfx: NONREACTIVE

## 2014-11-03 LAB — TROPONIN I

## 2014-11-03 MED ORDER — TECHNETIUM TC 99M SESTAMIBI GENERIC - CARDIOLITE
10.0000 | Freq: Once | INTRAVENOUS | Status: AC | PRN
  Administered 2014-11-03: 10 via INTRAVENOUS

## 2014-11-03 MED ORDER — TECHNETIUM TC 99M SESTAMIBI GENERIC - CARDIOLITE
30.0000 | Freq: Once | INTRAVENOUS | Status: AC | PRN
  Administered 2014-11-03: 30 via INTRAVENOUS

## 2014-11-03 MED ORDER — REGADENOSON 0.4 MG/5ML IV SOLN
INTRAVENOUS | Status: AC
  Administered 2014-11-03: 10:00:00
  Filled 2014-11-03: qty 5

## 2014-11-03 MED ORDER — REGADENOSON 0.4 MG/5ML IV SOLN
0.4000 mg | Freq: Once | INTRAVENOUS | Status: AC
  Administered 2014-11-03: 0.4 mg via INTRAVENOUS
  Filled 2014-11-03: qty 5

## 2014-11-03 MED ORDER — OMEPRAZOLE 20 MG PO CPDR: 20.0000 mg | DELAYED_RELEASE_CAPSULE | Freq: Every day | ORAL | Status: AC

## 2014-11-03 MED ORDER — IOHEXOL 350 MG/ML SOLN
100.0000 mL | Freq: Once | INTRAVENOUS | Status: AC | PRN
  Administered 2014-11-03: 100 mL via INTRAVENOUS

## 2014-11-03 NOTE — Discharge Summary (Signed)
[facility]  Physician Discharge Summary   Patient ID: Taylor Contreras MRN: [id number] DOB/AGE: Sep 08, 1960 54 y.o.  Admit date: 11/02/2014 Discharge date: 11/03/2014  PCP: Anthoney Harada, MD  DISCHARGE DIAGNOSES:  Principal Problem:   Chest pain at rest Active Problems:   Hypokalemia   Chest pain   RECOMMENDATIONS FOR OUTPATIENT FOLLOW UP: 1. Patient to follow-up with her cardiologist 2. Two-week trial of PPI  DISCHARGE CONDITION: fair  Diet recommendation: Low-sodium  Filed Weights   11/02/14 0601 11/02/14 1000  Weight: 77.565 kg (171 lb) 80.4 kg (177 lb 4 oz)    INITIAL HISTORY: 54 year old African-American female with no significant past medical history presented with pressure-like sensation in her chest. She had diffuse ST elevation on her EKG. She was hospitalized for further management.  Consultations:  Cardiology  Procedures: 2-D echocardiogram Study Conclusions - Left ventricle: The cavity size was normal. Wall thickness wasincreased in a pattern of mild LVH. Systolic function was normal.The estimated ejection fraction was in the range of 55% to 60%. - Mitral valve: There was mild regurgitation. - Right ventricle: The cavity size was mildly dilated. - Right atrium: The atrium was mildly dilated. No pericardial effusion was noted  Nuclear stress test  Study Result      There was no ST segment deviation noted during stress.  This is a low risk study.  The left ventricular ejection fraction is hyperdynamic (>65%).  Nuclear stress EF: 66%.  Low risk stress nuclear study with a small, mild, fixed distal anterior defect consistent with soft tissue attenuation; no ischemia; EF 66 and normal wall motion.    HOSPITAL COURSE:   Chest pain in the setting of abnormal EKG Diffuse ST elevation noted on EKG. However, her symptoms were not suggestive of pericarditis. Echocardiogram report as above. Patient seen by cardiology. She underwent a  stress test this morning which was a low-risk stress test. Cardiology has cleared her for discharge. CT angiogram of the chest was done for elevated d-dimer which was negative for pulmonary embolism. Even though she denies any symptoms of heartburn or acid reflux a two-week trial of PPI will be recommended. No clear indication to start her on NSAIDs. She will follow-up with her cardiologist, Dr. Wynonia Lawman. CRP, ESR normal. TSH 1.516. HIV nonreactive.  All of the above discussed with the patient. Stable for discharge.  PERTINENT LABS:  The results of significant diagnostics from this hospitalization (including imaging, microbiology, ancillary and laboratory) are listed below for reference.    Microbiology: Recent Results (from the past 240 hour(s))  MRSA PCR Screening     Status: None   Collection Time: 11/02/14 10:56 AM  Result Value Ref Range Status   MRSA by PCR NEGATIVE NEGATIVE Final    Comment:        The GeneXpert MRSA Assay (FDA approved for NASAL specimens only), is one component of a comprehensive MRSA colonization surveillance program. It is not intended to diagnose MRSA infection nor to guide or monitor treatment for MRSA infections.      Labs: Basic Metabolic Panel:  Recent Labs Lab 11/02/14 0607 11/02/14 1130 11/03/14 0813  NA 137  --  138  K 3.2*  --  3.7  CL 109  --  109  CO2 22  --  22  GLUCOSE 98  --  90  BUN 16  --  9  CREATININE 0.89  --  0.77  CALCIUM 9.5  --  8.9  MG  --  1.9  --  CBC:  Recent Labs Lab 11/02/14 0607 11/03/14 0813  WBC 5.4 4.0  HGB 11.8* 10.7*  HCT 36.7 33.3*  MCV 67.0* 68.7*  PLT 258 235   Cardiac Enzymes:  Recent Labs Lab 11/02/14 1734 11/02/14 2255 11/03/14 0449  TROPONINI <0.03 <0.03 <0.03    IMAGING STUDIES Dg Chest 2 View  11/02/2014   CLINICAL DATA:  Left chest pain for 1 week  EXAM: CHEST  2 VIEW  COMPARISON:  07/29/2014  FINDINGS: The heart size and mediastinal contours are within normal limits. Both  lungs are clear. The visualized skeletal structures are unremarkable.  IMPRESSION: No active cardiopulmonary disease.   Electronically Signed   By: Andreas Newport M.D.   On: 11/02/2014 06:52   Ct Angio Chest Pe W/cm &/or Wo Cm  11/03/2014   CLINICAL DATA:  Chest pain for 1 week, some left arm and left neck pain, elevated D-dimer  EXAM: CT ANGIOGRAPHY CHEST WITH CONTRAST  TECHNIQUE: Multidetector CT imaging of the chest was performed using the standard protocol during bolus administration of intravenous contrast. Multiplanar CT image reconstructions and MIPs were obtained to evaluate the vascular anatomy.  CONTRAST:  173m OMNIPAQUE IOHEXOL 350 MG/ML SOLN  COMPARISON:  Chest x-ray of 8 1 and 07/29/2014  FINDINGS: The pulmonary arteries are well opacified. There is no evidence of pulmonary embolism. The thoracic aorta is not as well opacified but no significant abnormality is seen. No mediastinal or hilar adenopathy is noted. The thyroid gland is unremarkable.  On lung window images, mild linear atelectasis or scarring is noted posteriorly within the left lower lobe. However, no lung infiltrate is seen. There is no evidence of pleural effusion. No suspicious lung nodule is noted. The central airway is patent. No bony abnormality is seen.  Review of the MIP images confirms the above findings.  IMPRESSION: Negative CT angiogram of the chest. No evidence of pulmonary embolism.   Electronically Signed   By: PIvar DrapeM.D.   On: 11/03/2014 12:58   Nm Myocar Multi W/spect W/wall Motion / Ef  11/03/2014    There was no ST segment deviation noted during stress.  This is a low risk study.  The left ventricular ejection fraction is hyperdynamic (>65%).  Nuclear stress EF: 66%.   Low risk stress nuclear study with a small, mild, fixed distal anterior  defect consistent with soft tissue attenuation; no ischemia; EF 66 and  normal wall motion.    DISCHARGE EXAMINATION: Filed Vitals:   11/03/14 1038 11/03/14 1040  11/03/14 1041 11/03/14 1240  BP: 115/81 116/80  133/83  Pulse: 96 98 94 70  Temp:    97.8 F (36.6 C)  TempSrc:    Oral  Resp:      Height:      Weight:      SpO2:    100%   General appearance: alert, cooperative, appears stated age and no distress Resp: clear to auscultation bilaterally Cardio: regular rate and rhythm, S1, S2 normal, no murmur, click, rub or gallop GI: soft, non-tender; bowel sounds normal; no masses,  no organomegaly Extremities: extremities normal, atraumatic, no cyanosis or edema  DISPOSITION: Home  Discharge Instructions    Call MD for:  difficulty breathing, headache or visual disturbances    Complete by:  As directed      Call MD for:  extreme fatigue    Complete by:  As directed      Call MD for:  persistant dizziness or light-headedness    Complete by:  As directed      Call MD for:  persistant nausea and vomiting    Complete by:  As directed      Call MD for:  severe uncontrolled pain    Complete by:  As directed      Diet - low sodium heart healthy    Complete by:  As directed      Discharge instructions    Complete by:  As directed   Please follow-up with Dr. Wynonia Lawman for further issues with chest pain. Trial of omeprazole for 2 weeks.  You were cared for by a hospitalist during your hospital stay. If you have any questions about your discharge medications or the care you received while you were in the hospital after you are discharged, you can call the unit and asked to speak with the hospitalist on call if the hospitalist that took care of you is not available. Once you are discharged, your primary care physician will handle any further medical issues. Please note that NO REFILLS for any discharge medications will be authorized once you are discharged, as it is imperative that you return to your primary care physician (or establish a relationship with a primary care physician if you do not have one) for your aftercare needs so that they can reassess  your need for medications and monitor your lab values. If you do not have a primary care physician, you can call [phone] for a physician referral.     Increase activity slowly    Complete by:  As directed            ALLERGIES: No Known Allergies    Current Discharge Medication List    START taking these medications   Details  omeprazole (PRILOSEC) 20 MG capsule Take 1 capsule (20 mg total) by mouth daily. For 2 weeks Qty: 14 capsule, Refills: 0      CONTINUE these medications which have NOT CHANGED   Details  Cholecalciferol (VITAMIN D PO) Take 1 tablet by mouth daily.      STOP taking these medications     Lorcaserin HCl (BELVIQ) 10 MG TABS        Follow-up Information    Follow up with Anthoney Harada, MD. Schedule an appointment as soon as possible for a visit in 1 week.   Specialty:  Family Medicine   Why:  post hospitalization follow up   Contact information:   [place] [address] [phone]       Follow up with Ezzard Standing, MD. Schedule an appointment as soon as possible for a visit in 2 weeks.   Specialty:  Cardiology   Contact information:   [address] [place] [address] [phone]       TOTAL DISCHARGE TIME: 72 minutes  [facility] Pager [phone]  11/03/2014, 2:39 PM

## 2014-11-03 NOTE — Discharge Instructions (Signed)

## 2014-11-03 NOTE — Progress Notes (Signed)
Patient Name: Taylor Contreras Date of Encounter: 11/03/2014     Principal Problem:   Chest pain at rest Active Problems:   Hypokalemia    SUBJECTIVE  Intermittent L sided chest discomfort radiating down L upper arm. Denies exacerbating factors such as palpation, body rotation, or deep inspiration. No SOB  CURRENT MEDS . enoxaparin (LOVENOX) injection  40 mg Subcutaneous Q24H  . ibuprofen  800 mg Oral TID  . pantoprazole  40 mg Oral Daily  . potassium chloride  20 mEq Oral BID    OBJECTIVE  Filed Vitals:   11/02/14 2000 11/02/14 2357 11/03/14 0000 11/03/14 0400  BP: 122/84 119/69 119/69 119/79  Pulse: 72 69 71 73  Temp:  98 F (36.7 C)  97.9 F (36.6 C)  TempSrc:  Oral  Oral  Resp:  14  15  Height:      Weight:      SpO2: 99% 99% 100% 98%    Intake/Output Summary (Last 24 hours) at 11/03/14 0816 Last data filed at 11/03/14 0600  Gross per 24 hour  Intake   1880 ml  Output      0 ml  Net   1880 ml   Filed Weights   11/02/14 0601 11/02/14 1000  Weight: 171 lb (77.565 kg) 177 lb 4 oz (80.4 kg)    PHYSICAL EXAM  General: Pleasant, NAD. Neuro: Alert and oriented X 3. Moves all extremities spontaneously. Psych: Normal affect. HEENT:  Normal  Neck: Supple without bruits or JVD. Lungs:  Resp regular and unlabored, CTA. Heart: RRR no s3, s4, or murmurs. Abdomen: Soft, non-tender, non-distended, BS + x 4.  Extremities: No clubbing, cyanosis or edema. DP/PT/Radials 2+ and equal bilaterally.  Accessory Clinical Findings  CBC  Recent Labs  11/02/14 0607  WBC 5.4  HGB 11.8*  HCT 36.7  MCV 67.0*  PLT 258   Basic Metabolic Panel  Recent Labs  11/02/14 0607 11/02/14 1130  NA 137  --   K 3.2*  --   CL 109  --   CO2 22  --   GLUCOSE 98  --   BUN 16  --   CREATININE 0.89  --   CALCIUM 9.5  --   MG  --  1.9   Cardiac Enzymes  Recent Labs  11/02/14 1734 11/02/14 2255 11/03/14 0449  TROPONINI <0.03 <0.03 <0.03   D-Dimer  Recent Labs  11/02/14 1734  DDIMER 0.59*   Thyroid Function Tests  Recent Labs  11/02/14 1130  TSH 1.516    TELE NSR with HR 70s. No obvious ectopy    ECG  Diffuse J point elevation with PR depression  Echocardiogram 11/02/2014  LV EF: 55% -  60%  ------------------------------------------------------------------- Indications:   Chest pain 786.51.  ------------------------------------------------------------------- History:  PMH: Rule out pericarditis.  ------------------------------------------------------------------- Study Conclusions  - Left ventricle: The cavity size was normal. Wall thickness was increased in a pattern of mild LVH. Systolic function was normal. The estimated ejection fraction was in the range of 55% to 60%. - Mitral valve: There was mild regurgitation. - Right ventricle: The cavity size was mildly dilated. - Right atrium: The atrium was mildly dilated    Radiology/Studies  Dg Chest 2 View  11/02/2014   CLINICAL DATA:  Left chest pain for 1 week  EXAM: CHEST  2 VIEW  COMPARISON:  07/29/2014  FINDINGS: The heart size and mediastinal contours are within normal limits. Both lungs are clear. The visualized skeletal structures are unremarkable.  IMPRESSION:  No active cardiopulmonary disease.   Electronically Signed   By: Ellery Plunk M.D.   On: 11/02/2014 06:52    ASSESSMENT AND PLAN  54 year old [place] American female with no significant past history presenting with a one-week history of progressive left-sided chest and arm pain.  1. Chest pain with mixed typical and atypical features  - Echo 11/02/2014 EF 55-60%, mild LVH, mild MR  - serial trop negative. CRP and sed rate WNL. TSH normal  - EKG showed minimal diffuse ST elevation with PR depression. Although per Dr. Tenny Craw, do not think it is pericarditis.  - d-dimer elevated, pending CTA of chest  - per consult note yesterday, if serial trop negative, pending myoview today  - if both CTA and  myoview negative, likely can be discharged.   2. Hypokalemia  Signed, Amedeo Plenty Pager: [phone]  I have personally seen and examined this patient with Taylor Course, PA-C. I agree with the assessment and plan as outlined above. Her chest pain is atypical. Echo overall normal. No PE on CTA. Stress myoview is pending. If no ischemia on stress test, will be able to d/c home and follow up with Dr. Tenny Craw after d/c.   Taevyn Hausen 11/03/2014 1:11 PM

## 2014-11-04 LAB — QUANTIFERON IN TUBE
QFT TB AG MINUS NIL VALUE: <0 IU/mL
QUANTIFERON MITOGEN VALUE: >10 IU/mL
QUANTIFERON TB AG VALUE: 0.97 IU/mL
QUANTIFERON TB GOLD: NEGATIVE
Quantiferon Nil Value: 1.02 IU/mL

## 2014-11-04 LAB — QUANTIFERON TB GOLD ASSAY (BLOOD)

## 2015-06-10 DIAGNOSIS — J111 Influenza due to unidentified influenza virus with other respiratory manifestations: Secondary | ICD-10-CM | POA: Diagnosis not present

## 2015-06-10 DIAGNOSIS — J0100 Acute maxillary sinusitis, unspecified: Secondary | ICD-10-CM | POA: Diagnosis not present

## 2015-06-10 MED FILL — AMOX-CLAV 875-125 MG TABLET: 875-125 | 10 days supply | Qty: 20 | Fill #0

## 2015-06-10 MED FILL — OSELTAMIVIR PHOS 75 MG CAP: 75 | 5 days supply | Qty: 10 | Fill #0

## 2015-07-08 DIAGNOSIS — M7541 Impingement syndrome of right shoulder: Secondary | ICD-10-CM | POA: Diagnosis not present

## 2015-09-16 DIAGNOSIS — M25511 Pain in right shoulder: Secondary | ICD-10-CM | POA: Diagnosis not present

## 2015-09-16 DIAGNOSIS — E559 Vitamin D deficiency, unspecified: Secondary | ICD-10-CM | POA: Diagnosis not present

## 2015-09-16 DIAGNOSIS — R011 Cardiac murmur, unspecified: Secondary | ICD-10-CM | POA: Diagnosis not present

## 2015-09-16 DIAGNOSIS — R030 Elevated blood-pressure reading, without diagnosis of hypertension: Secondary | ICD-10-CM | POA: Diagnosis not present

## 2015-09-16 DIAGNOSIS — E663 Overweight: Secondary | ICD-10-CM | POA: Diagnosis not present

## 2015-09-16 DIAGNOSIS — F4329 Adjustment disorder with other symptoms: Secondary | ICD-10-CM | POA: Diagnosis not present

## 2015-09-16 DIAGNOSIS — E7800 Pure hypercholesterolemia, unspecified: Secondary | ICD-10-CM | POA: Diagnosis not present

## 2015-09-16 DIAGNOSIS — Z6830 Body mass index (BMI) 30.0-30.9, adult: Secondary | ICD-10-CM | POA: Diagnosis not present

## 2015-09-17 DIAGNOSIS — H5213 Myopia, bilateral: Secondary | ICD-10-CM | POA: Diagnosis not present

## 2016-02-21 DIAGNOSIS — E663 Overweight: Secondary | ICD-10-CM | POA: Diagnosis not present

## 2016-02-21 DIAGNOSIS — E559 Vitamin D deficiency, unspecified: Secondary | ICD-10-CM | POA: Diagnosis not present

## 2016-02-21 DIAGNOSIS — R011 Cardiac murmur, unspecified: Secondary | ICD-10-CM | POA: Diagnosis not present

## 2016-02-21 DIAGNOSIS — Z0000 Encounter for general adult medical examination without abnormal findings: Secondary | ICD-10-CM | POA: Diagnosis not present

## 2016-02-21 DIAGNOSIS — Z6827 Body mass index (BMI) 27.0-27.9, adult: Secondary | ICD-10-CM | POA: Diagnosis not present

## 2016-02-21 DIAGNOSIS — D649 Anemia, unspecified: Secondary | ICD-10-CM | POA: Diagnosis not present

## 2016-02-21 DIAGNOSIS — F4329 Adjustment disorder with other symptoms: Secondary | ICD-10-CM | POA: Diagnosis not present

## 2016-02-21 DIAGNOSIS — E7800 Pure hypercholesterolemia, unspecified: Secondary | ICD-10-CM | POA: Diagnosis not present

## 2016-04-11 DIAGNOSIS — J069 Acute upper respiratory infection, unspecified: Secondary | ICD-10-CM | POA: Diagnosis not present

## 2016-04-11 MED FILL — BENZONATATE 200 MG CAP: 200 | 10 days supply | Qty: 30 | Fill #0

## 2016-04-11 MED FILL — IPRATROPIUM 0.03% SPRAY: 0.03 | 30 days supply | Qty: 30 | Fill #0

## 2016-05-20 IMAGING — CR DG CHEST 2V
2 series · 2 of 2 positions shown · non-contrast
Comparison: 07/29/2014

CLINICAL DATA: Left chest pain for 1 week

EXAM:
CHEST  2 VIEW

[chest pa]
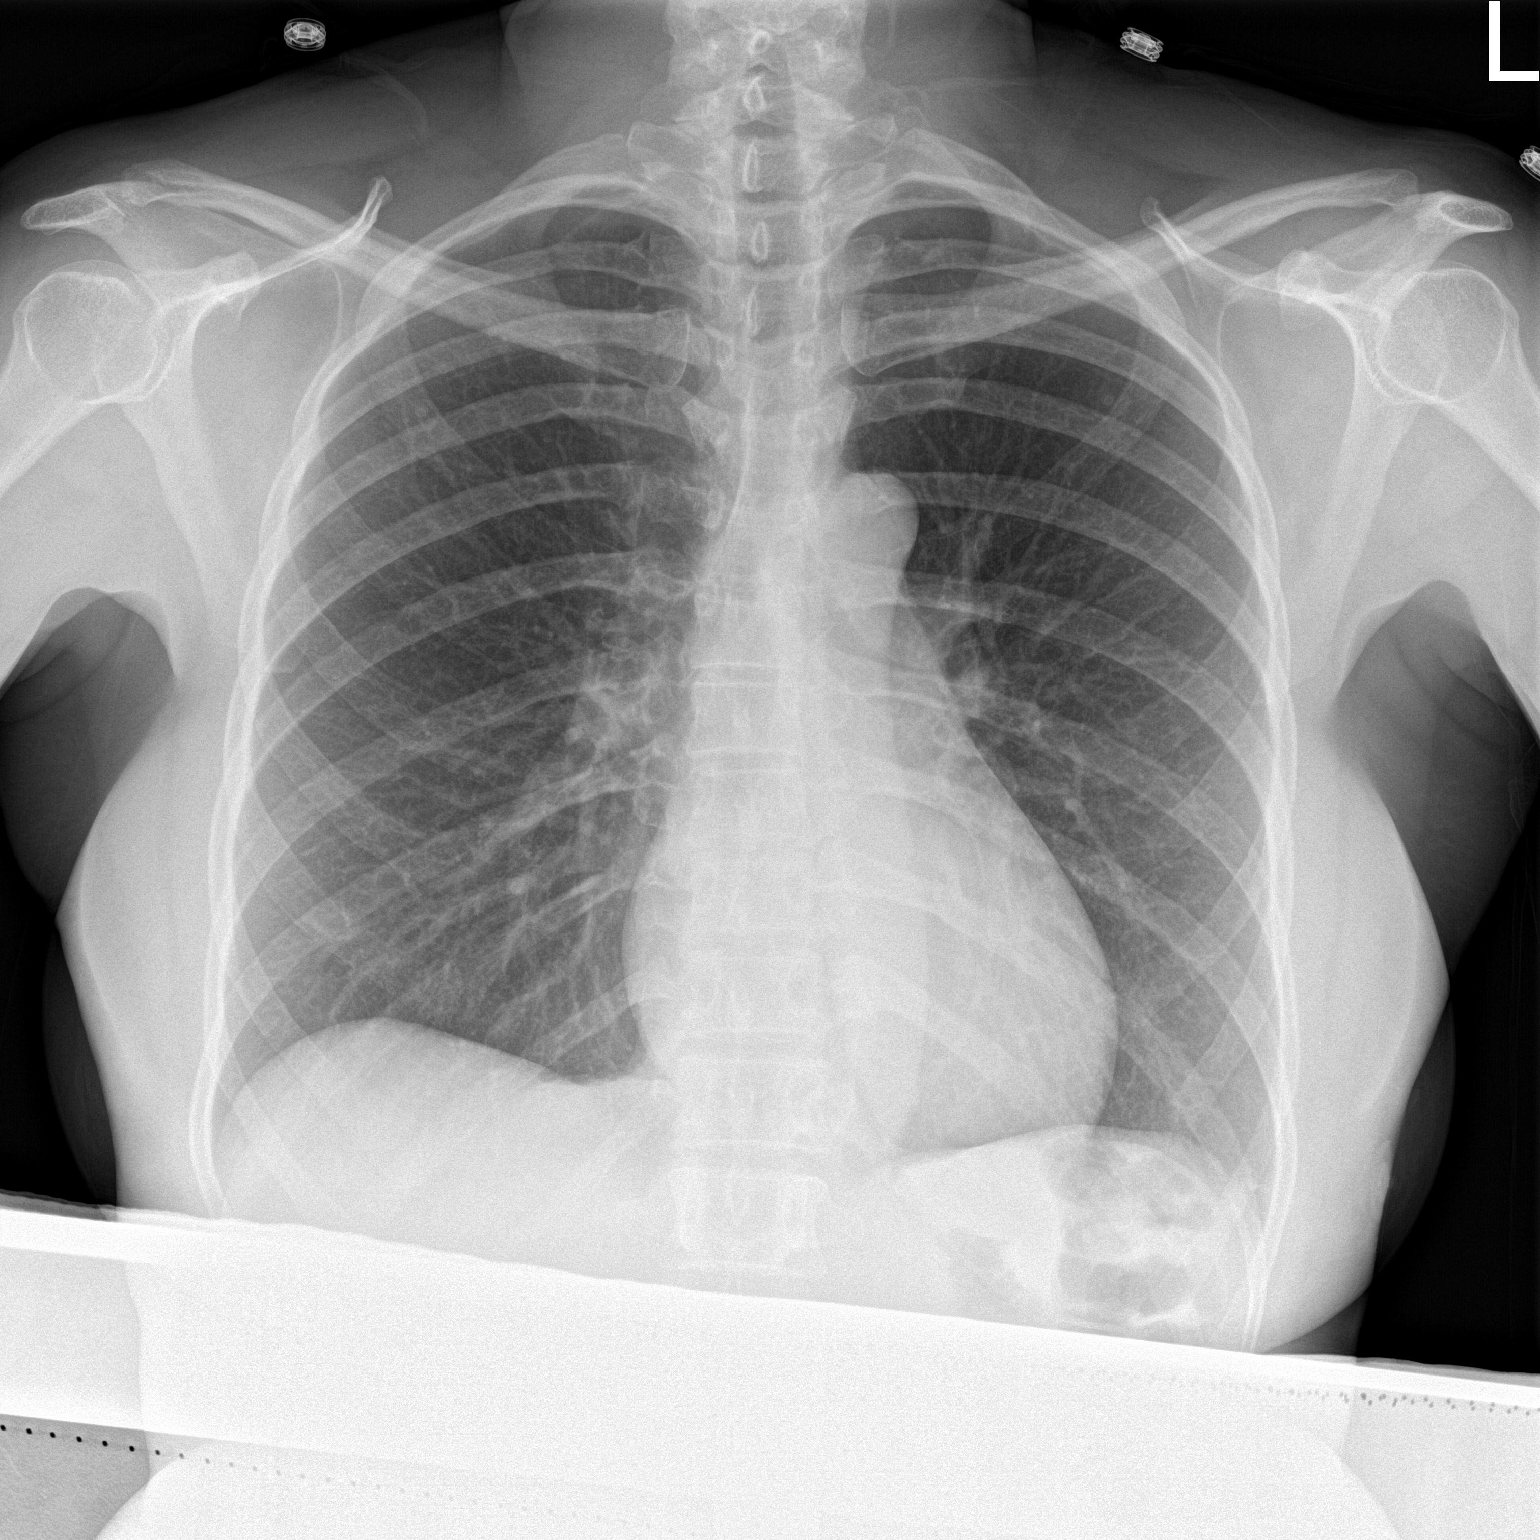

[chest lat]
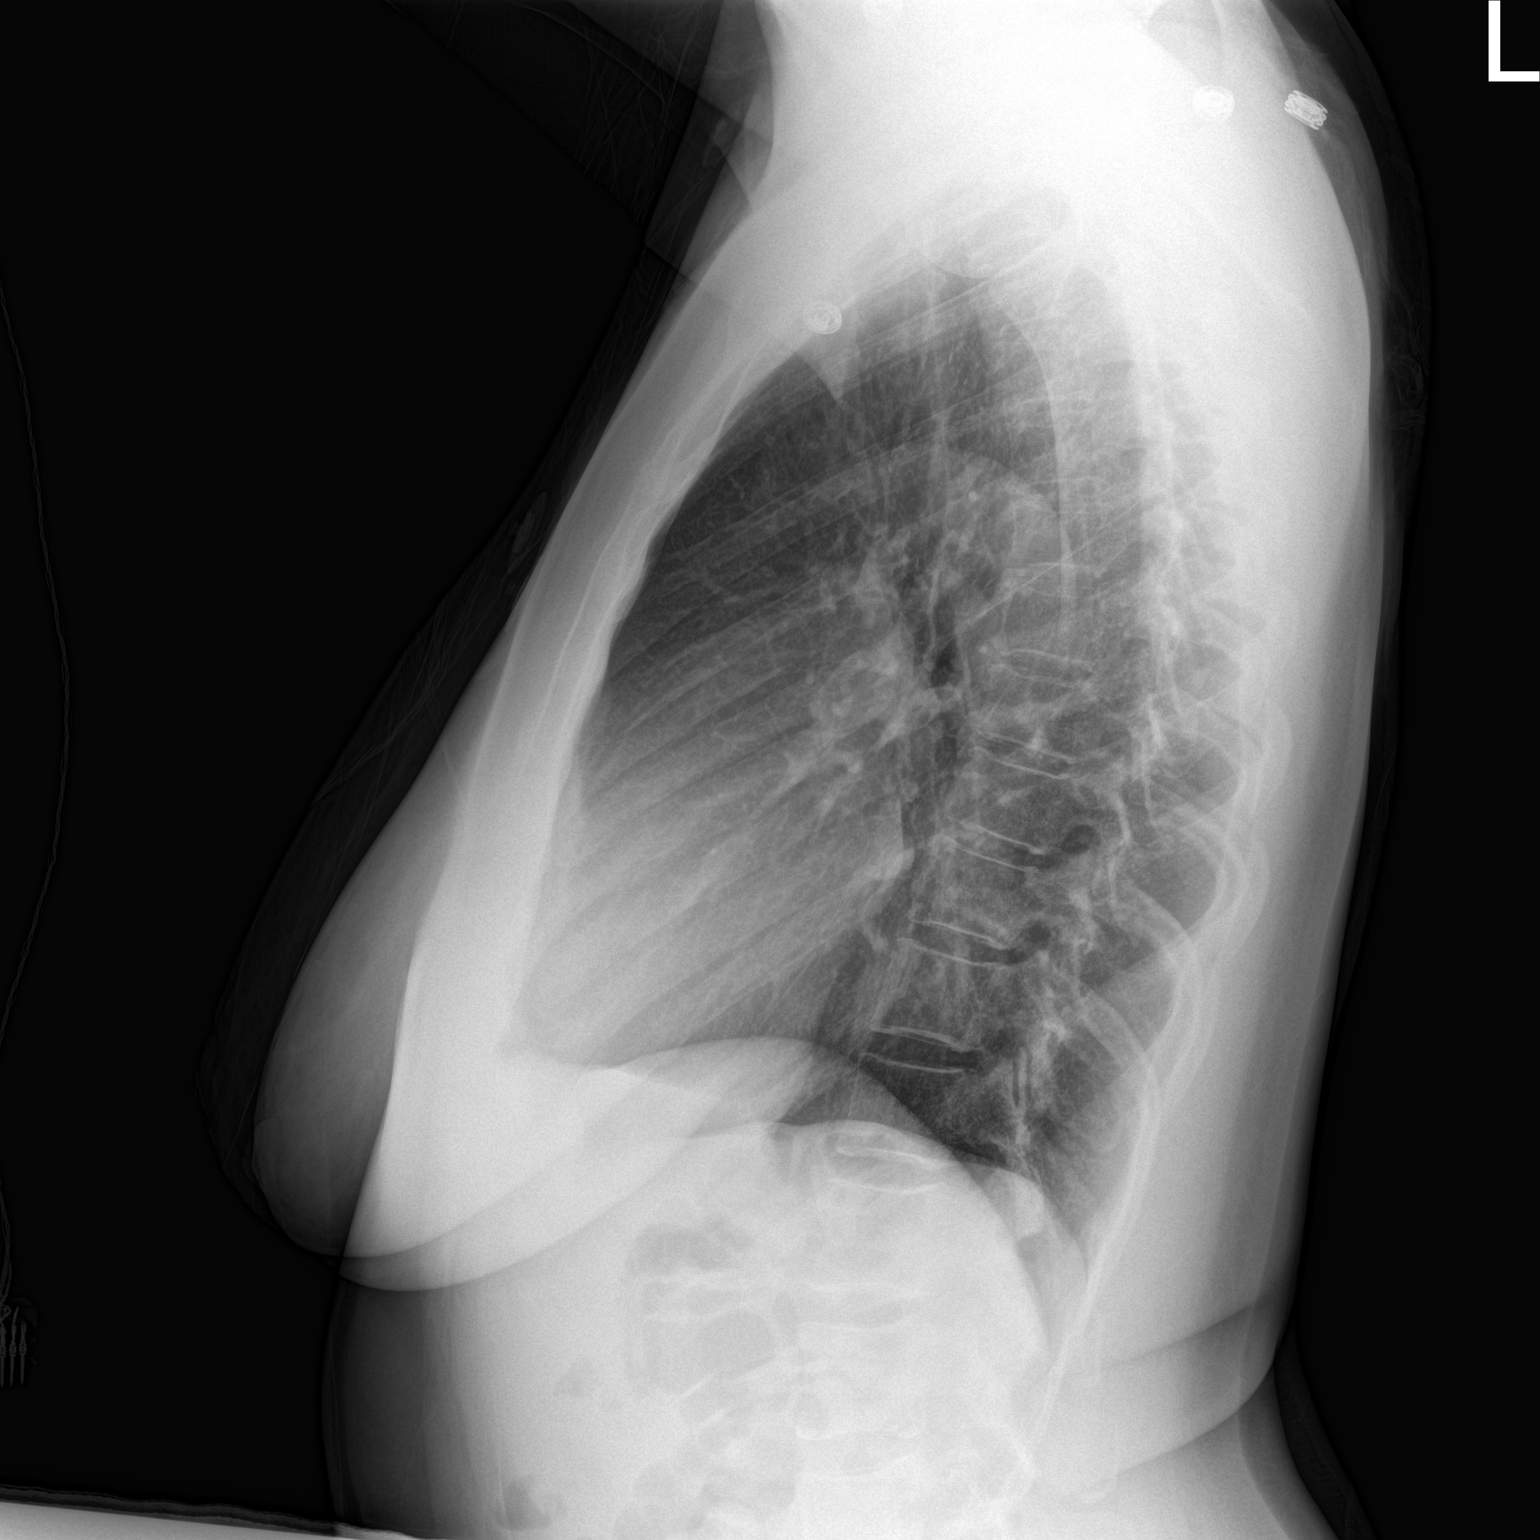

[2 of 2 positions shown; findings below may reference images not displayed]

FINDINGS: The heart size and mediastinal contours are within normal limits.
Both lungs are clear. The visualized skeletal structures are
unremarkable.
IMPRESSION: No active cardiopulmonary disease.

## 2016-08-22 DIAGNOSIS — Z124 Encounter for screening for malignant neoplasm of cervix: Secondary | ICD-10-CM | POA: Diagnosis not present

## 2016-08-22 DIAGNOSIS — Z6826 Body mass index (BMI) 26.0-26.9, adult: Secondary | ICD-10-CM | POA: Diagnosis not present

## 2016-08-22 DIAGNOSIS — Z1231 Encounter for screening mammogram for malignant neoplasm of breast: Secondary | ICD-10-CM | POA: Diagnosis not present

## 2016-08-22 DIAGNOSIS — Z01419 Encounter for gynecological examination (general) (routine) without abnormal findings: Secondary | ICD-10-CM | POA: Diagnosis not present

## 2016-08-29 ENCOUNTER — Other Ambulatory Visit: Payer: Self-pay | Admitting: Obstetrics and Gynecology

## 2016-08-29 DIAGNOSIS — R928 Other abnormal and inconclusive findings on diagnostic imaging of breast: Secondary | ICD-10-CM

## 2016-09-01 ENCOUNTER — Ambulatory Visit
Admission: RE | Admit: 2016-09-01 | Discharge: 2016-09-01 | Disposition: A | Discharge status: Home / Self Care | Payer: 59 | Source: Ambulatory Visit | Attending: Obstetrics and Gynecology | Admitting: Obstetrics and Gynecology

## 2016-09-01 ENCOUNTER — Other Ambulatory Visit: Payer: Commercial Managed Care - PPO

## 2016-09-01 DIAGNOSIS — N6311 Unspecified lump in the right breast, upper outer quadrant: Secondary | ICD-10-CM | POA: Diagnosis not present

## 2016-09-01 DIAGNOSIS — R922 Inconclusive mammogram: Secondary | ICD-10-CM | POA: Diagnosis not present

## 2016-09-01 DIAGNOSIS — R928 Other abnormal and inconclusive findings on diagnostic imaging of breast: Secondary | ICD-10-CM

## 2016-09-18 DIAGNOSIS — H04123 Dry eye syndrome of bilateral lacrimal glands: Secondary | ICD-10-CM | POA: Diagnosis not present

## 2016-09-18 DIAGNOSIS — H52223 Regular astigmatism, bilateral: Secondary | ICD-10-CM | POA: Diagnosis not present

## 2016-09-18 DIAGNOSIS — H5213 Myopia, bilateral: Secondary | ICD-10-CM | POA: Diagnosis not present

## 2022-07-20 ENCOUNTER — Other Ambulatory Visit: Payer: Self-pay | Admitting: Pain Medicine

## 2022-07-20 DIAGNOSIS — Z1231 Encounter for screening mammogram for malignant neoplasm of breast: Secondary | ICD-10-CM

## 2022-07-20 DIAGNOSIS — Z780 Asymptomatic menopausal state: Secondary | ICD-10-CM
